# Patient Record
Sex: Female | Born: 2000 | Race: White | Hispanic: No | Marital: Single | Smoking: Never smoker
Health system: Southern US, Community
[De-identification: ages and names within clinical notes are randomized; demographics above are authoritative.]

## PROBLEM LIST (undated history)

## (undated) DIAGNOSIS — J302 Other seasonal allergic rhinitis: Secondary | ICD-10-CM

## (undated) DIAGNOSIS — Q185 Microstomia: Secondary | ICD-10-CM

## (undated) DIAGNOSIS — S82899A Other fracture of unspecified lower leg, initial encounter for closed fracture: Secondary | ICD-10-CM

---
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
## 2019-02-25 DIAGNOSIS — H5213 Myopia, bilateral: Secondary | ICD-10-CM | POA: Diagnosis not present

## 2019-03-03 DIAGNOSIS — Z23 Encounter for immunization: Secondary | ICD-10-CM | POA: Diagnosis not present

## 2019-03-19 DIAGNOSIS — H5213 Myopia, bilateral: Secondary | ICD-10-CM | POA: Diagnosis not present

## 2019-03-27 ENCOUNTER — Ambulatory Visit: Payer: Medicaid Other | Attending: Internal Medicine

## 2019-03-27 DIAGNOSIS — Z20828 Contact with and (suspected) exposure to other viral communicable diseases: Secondary | ICD-10-CM | POA: Diagnosis not present

## 2019-03-27 DIAGNOSIS — Z20822 Contact with and (suspected) exposure to covid-19: Secondary | ICD-10-CM

## 2019-03-28 LAB — NOVEL CORONAVIRUS, NAA: SARS-CoV-2, NAA: NOT DETECTED

## 2019-04-07 IMAGING — DX DG ANKLE COMPLETE 3+V*R*
3 series · 3 of 3 positions shown · non-contrast
Comparison: None.

CLINICAL DATA: Injury

EXAM:
RIGHT ANKLE - COMPLETE 3+ VIEW

[ankle ap]
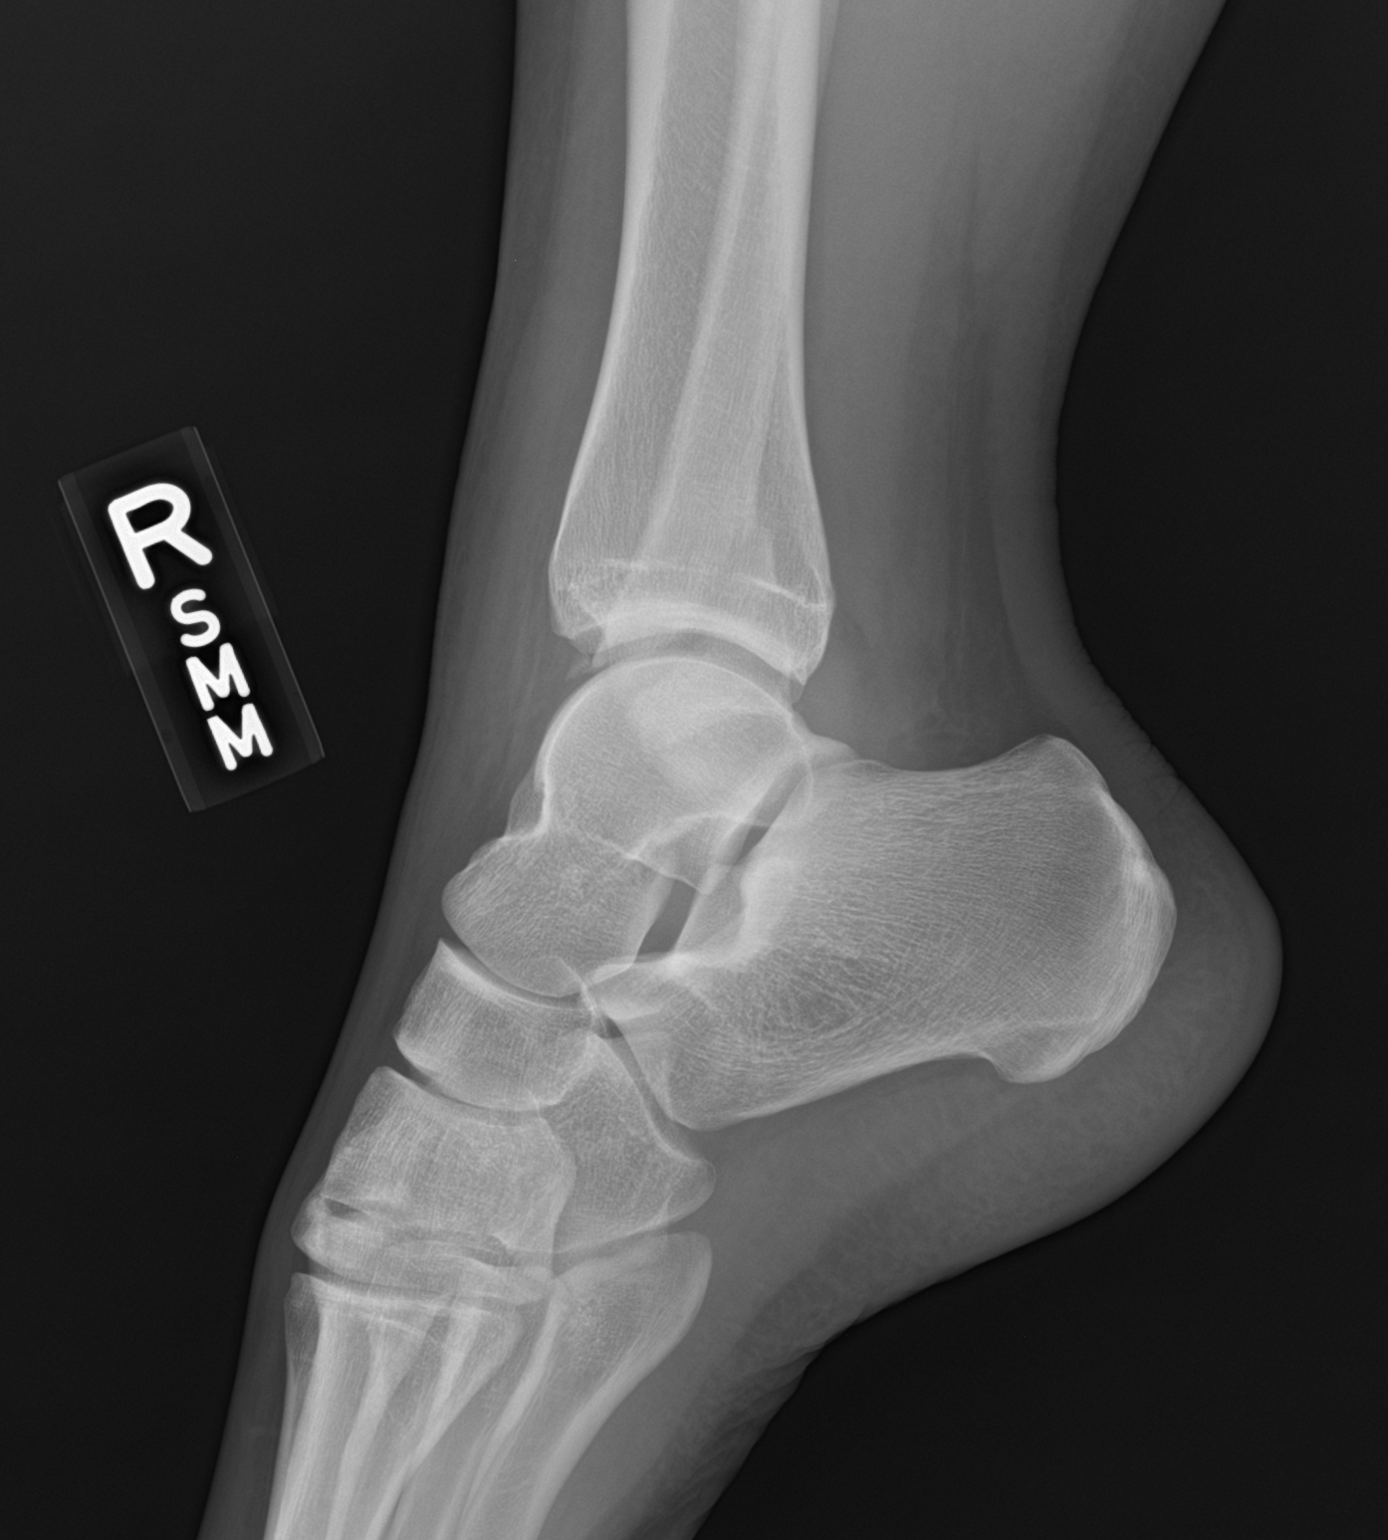

[ankle obl]
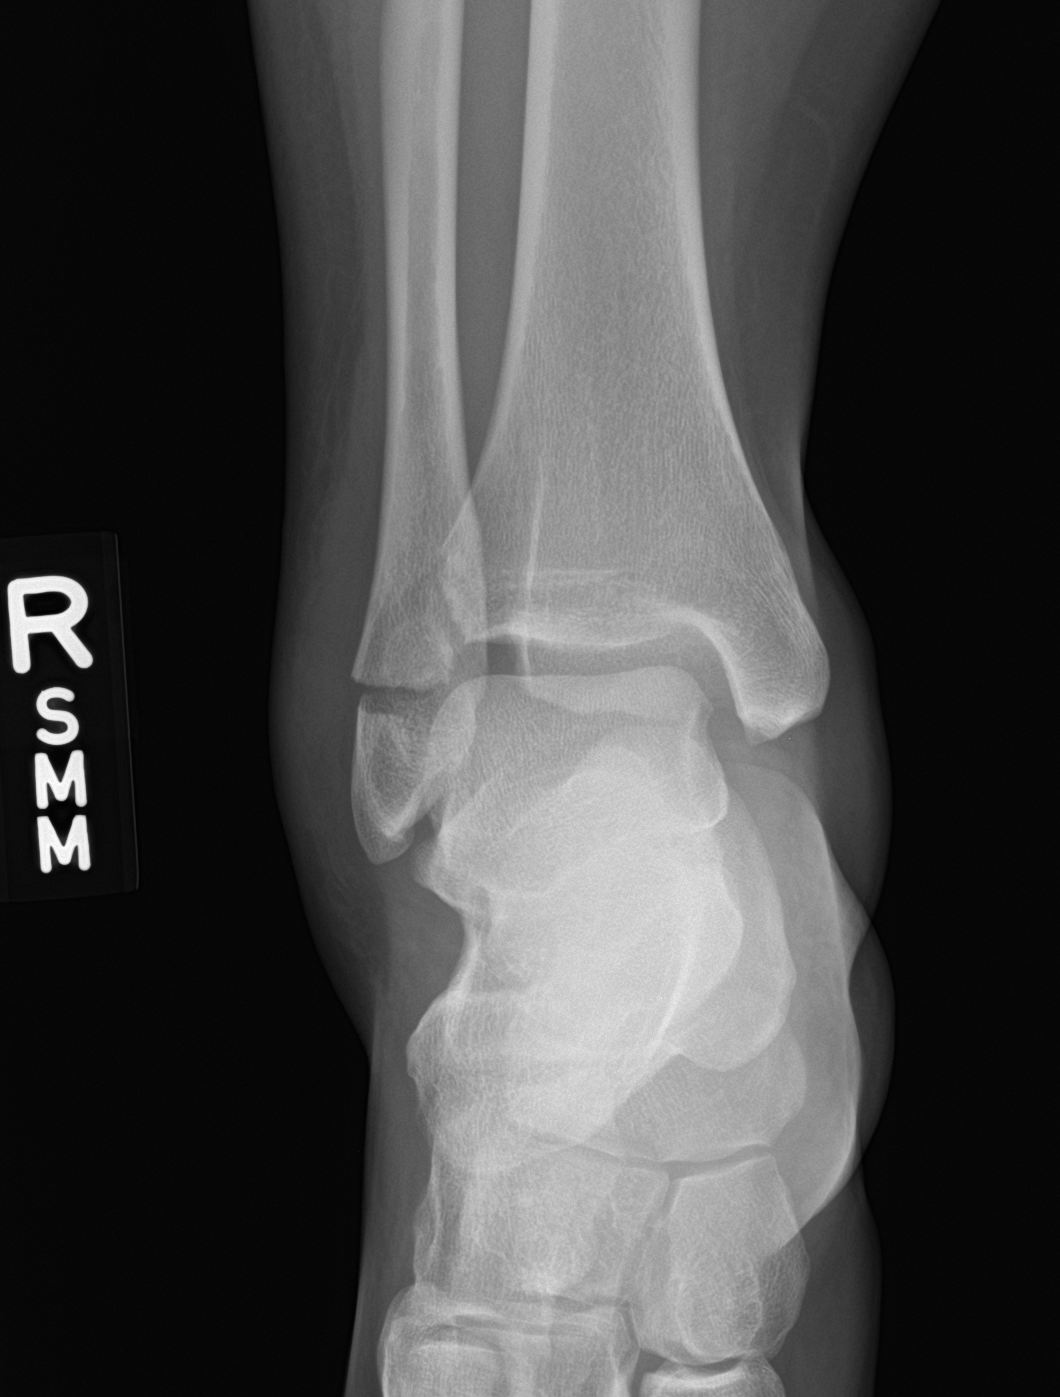

[ankle lat]
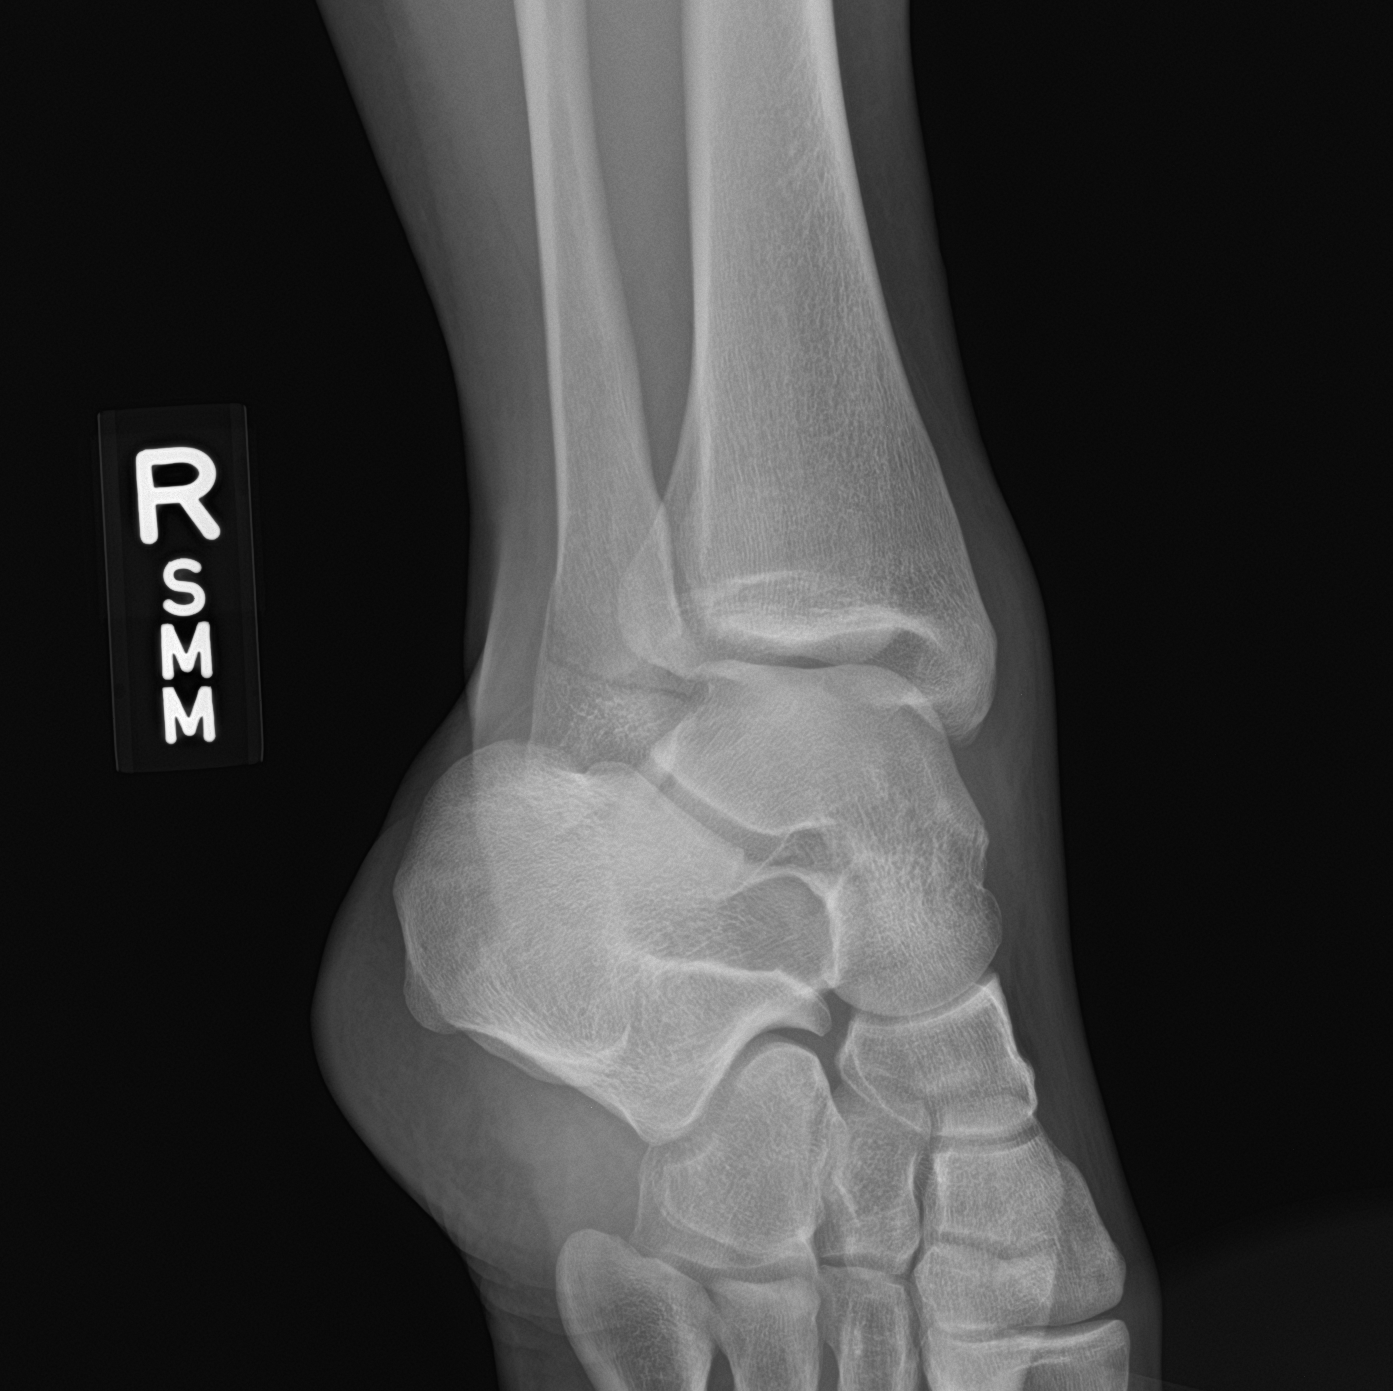

[3 of 3 positions shown; findings below may reference images not displayed]

FINDINGS: There is a transverse and displaced fracture in the distal fibula
through the lateral malleolus which extends into the ankle joint.
There is 5 mm of distraction at the fracture site. There is
associated soft tissue swelling. There is a tiny bony fragment
anterior to the tibial plafond the the lateral the ankle mortise is
not clearly visualized on the oblique view.
IMPRESSION: Displaced lateral malleolus fracture as described

Tiny bone fragment anterior to the tibial plafond on the lateral
view. The exact location is not clearly visualized. It may represent
a small fragment from the distal fibula.

## 2019-04-28 ENCOUNTER — Ambulatory Visit: Payer: Medicaid Other | Attending: Internal Medicine

## 2019-04-28 DIAGNOSIS — Z20822 Contact with and (suspected) exposure to covid-19: Secondary | ICD-10-CM | POA: Diagnosis not present

## 2019-04-29 LAB — NOVEL CORONAVIRUS, NAA: SARS-CoV-2, NAA: NOT DETECTED

## 2019-09-01 ENCOUNTER — Ambulatory Visit (INDEPENDENT_AMBULATORY_CARE_PROVIDER_SITE_OTHER): Payer: Medicaid Other | Admitting: Pediatrics

## 2019-09-01 ENCOUNTER — Encounter: Payer: Self-pay | Admitting: Pediatrics

## 2019-09-01 ENCOUNTER — Other Ambulatory Visit (HOSPITAL_COMMUNITY)
Admission: RE | Admit: 2019-09-01 | Discharge: 2019-09-01 | Disposition: A | Discharge status: Home / Self Care | Payer: Medicaid Other | Source: Ambulatory Visit | Attending: Pediatrics | Admitting: Pediatrics

## 2019-09-01 ENCOUNTER — Other Ambulatory Visit: Payer: Self-pay

## 2019-09-01 VITALS — BP 120/64 | HR 85 | Temp 99.4°F | Ht 66.0 in | Wt 178.4 lb

## 2019-09-01 DIAGNOSIS — E663 Overweight: Secondary | ICD-10-CM | POA: Diagnosis not present

## 2019-09-01 DIAGNOSIS — Z113 Encounter for screening for infections with a predominantly sexual mode of transmission: Secondary | ICD-10-CM

## 2019-09-01 DIAGNOSIS — Z3009 Encounter for other general counseling and advice on contraception: Secondary | ICD-10-CM

## 2019-09-01 DIAGNOSIS — Z68.41 Body mass index (BMI) pediatric, 85th percentile to less than 95th percentile for age: Secondary | ICD-10-CM | POA: Diagnosis not present

## 2019-09-01 DIAGNOSIS — Z3202 Encounter for pregnancy test, result negative: Secondary | ICD-10-CM | POA: Diagnosis not present

## 2019-09-01 DIAGNOSIS — Z23 Encounter for immunization: Secondary | ICD-10-CM | POA: Diagnosis not present

## 2019-09-01 DIAGNOSIS — Z6828 Body mass index (BMI) 28.0-28.9, adult: Secondary | ICD-10-CM

## 2019-09-01 DIAGNOSIS — J029 Acute pharyngitis, unspecified: Secondary | ICD-10-CM

## 2019-09-01 DIAGNOSIS — Z0001 Encounter for general adult medical examination with abnormal findings: Secondary | ICD-10-CM | POA: Diagnosis not present

## 2019-09-01 LAB — POCT URINE PREGNANCY: Preg Test, Ur: NEGATIVE

## 2019-09-01 LAB — POCT RAPID STREP A (OFFICE): Rapid Strep A Screen: NEGATIVE

## 2019-09-01 LAB — POCT RAPID HIV: Rapid HIV, POC: NEGATIVE

## 2019-09-01 MED ORDER — AMOXICILLIN 500 MG PO CAPS: 500.0000 mg | ORAL_CAPSULE | Freq: Two times a day (BID) | ORAL | 0 refills | Status: AC

## 2019-09-01 MED ORDER — NORETHINDRONE ACET-ETHINYL EST 1.5-30 MG-MCG PO TABS: 1.0000 | ORAL_TABLET | Freq: Every day | ORAL | 11 refills | Status: AC

## 2019-09-01 NOTE — Patient Instructions (Signed)
Adult Primary Care Clinics Name Criteria Services   [facility]  Address: [address] [place], [place] [address]  Phone: [phone] Hours: Monday - Friday 9 AM -6 PM  Types of insurance accepted:  Commercial insurance [facility] (orange card) Medicaid Medicare Uninsured  Language services:  Video and phone interpreters available   Ages 18 and older    Adult primary care Onsite pharmacy Integrated behavioral health Financial assistance counseling Walk-in hours for established patients  Financial assistance counseling hours: Tuesdays 2:00PM - 5:00PM  Thursday 8:30AM - 4:30PM  Space is limited, 10 on Tuesday and 20 on Thursday. It's on first come first serve basis  Name Criteria Services   [facility]  Address: [address] [place], [place] [address]  Phone: [phone]  Hours: Monday - Friday 8:30 AM - 5 PM  Types of insurance accepted:  Commercial insurance Medicaid Medicare Uninsured  Language services:  Video and phone interpreters available   All ages - newborn to adult   Primary care for all ages (children and adults) Integrated behavioral health Nutritionist Financial assistance counseling   Name Criteria Services   [facility]  Located on the ground floor of [facility]  Address: [address]  [place],  [place]  [address]  Phone: [phone]  Hours: Monday - Friday 8:15 AM - 5 PM  Types of insurance accepted:  Commercial insurance Medicaid Medicare Uninsured  Language services:  Video and phone interpreters available   Ages 18 and older   Adult primary care Nutritionist Certified Diabetes Educator  Integrated behavioral health Financial assistance counseling   Name Criteria Services    [facility]  Address: [address] [place], [place] [address]  Phone:  [phone]  Hours: Monday - Friday 8:30 AM - 5 PM    Types of insurance accepted:  Commercial insurance Medicaid Medicare Uninsured  Language services:  Video and phone interpreters available   All ages - newborn to adult   Primary care for all ages (children and adults) Integrated behavioral health Financial assistance counseling    
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
## 2019-09-02 LAB — URINE CYTOLOGY ANCILLARY ONLY
Chlamydia: NEGATIVE
Comment: NEGATIVE
Comment: NORMAL
Neisseria Gonorrhea: NEGATIVE

## 2019-09-03 LAB — CULTURE, GROUP A STREP
MICRO NUMBER:: 10517015
SPECIMEN QUALITY:: ADEQUATE

## 2019-09-03 NOTE — Progress Notes (Signed)
Patient called and left message in the nurse line stating that she missed a call from [place], she confirmed that she saw the lab results and Dr. Lona Kettle message via MyChart.

## 2019-09-04 ENCOUNTER — Telehealth: Payer: Self-pay

## 2019-09-04 ENCOUNTER — Telehealth: Payer: Self-pay | Admitting: Pediatrics

## 2019-09-04 MED ORDER — CLINDAMYCIN HCL 300 MG PO CAPS
300.0000 mg | ORAL_CAPSULE | Freq: Three times a day (TID) | ORAL | 0 refills | Status: AC
Start: 2019-09-04 — End: 2019-09-14

## 2019-09-04 NOTE — Telephone Encounter (Signed)
Patient notified

## 2019-09-04 NOTE — Telephone Encounter (Signed)
The patient states that she was prescribed Amoxicillin 500mg  for an throat infection. Dose is for five days and she's been taking it for three and is not getting better. She is requesting a higher dosage or another antibiotic as she will be leaving the state in two days.

## 2019-09-04 NOTE — Telephone Encounter (Signed)
Please complete the Amoxicillin as prescribed  I am going to ADD Clindamycin 300 mg three times a day for 10 days.   She may have some diarrhea with the additional medicine.   She may needs to seek medical care if she is still not improving in 5-7 days.   Usually mouth infections will get better on Amoxicillin or Clindamycin if the are bacterial.

## 2019-09-04 NOTE — Telephone Encounter (Signed)
Patient would like a call from Dr Kathlene November to discuss a concern with the medication that was prescribed. Number on file is correct.

## 2019-09-05 ENCOUNTER — Ambulatory Visit (HOSPITAL_COMMUNITY)
Admission: EM | Admit: 2019-09-05 | Discharge: 2019-09-05 | Disposition: A | Discharge status: Home / Self Care | Payer: Medicaid Other | Attending: Internal Medicine | Admitting: Internal Medicine

## 2019-09-05 ENCOUNTER — Other Ambulatory Visit: Payer: Self-pay

## 2019-09-05 ENCOUNTER — Encounter (HOSPITAL_COMMUNITY): Payer: Self-pay | Admitting: Emergency Medicine

## 2019-09-05 DIAGNOSIS — J029 Acute pharyngitis, unspecified: Secondary | ICD-10-CM | POA: Diagnosis not present

## 2019-09-05 LAB — CBC WITH DIFFERENTIAL/PLATELET
Abs Immature Granulocytes: 0.01 10*3/uL (ref 0.00–0.07)
Basophils Absolute: 0.1 10*3/uL (ref 0.0–0.1)
Basophils Relative: 1 %
Eosinophils Absolute: 0.1 10*3/uL (ref 0.0–0.5)
Eosinophils Relative: 1 %
HCT: 38.5 % (ref 36.0–46.0)
Hemoglobin: 12.5 g/dL (ref 12.0–15.0)
Immature Granulocytes: 0 %
Lymphocytes Relative: 57 %
Lymphs Abs: 4.1 10*3/uL — ABNORMAL HIGH (ref 0.7–4.0)
MCH: 28.4 pg (ref 26.0–34.0)
MCHC: 32.5 g/dL (ref 30.0–36.0)
MCV: 87.5 fL (ref 80.0–100.0)
Monocytes Absolute: 0.4 10*3/uL (ref 0.1–1.0)
Monocytes Relative: 6 %
Neutro Abs: 2.5 10*3/uL (ref 1.7–7.7)
Neutrophils Relative %: 35 %
Platelets: 234 10*3/uL (ref 150–400)
RBC: 4.4 MIL/uL (ref 3.87–5.11)
RDW: 12.8 % (ref 11.5–15.5)
WBC: 7.1 10*3/uL (ref 4.0–10.5)
nRBC: 0 % (ref 0.0–0.2)

## 2019-09-05 LAB — COMPREHENSIVE METABOLIC PANEL
ALT: 98 U/L — ABNORMAL HIGH (ref 0–44)
AST: 61 U/L — ABNORMAL HIGH (ref 15–41)
Albumin: 3.9 g/dL (ref 3.5–5.0)
Alkaline Phosphatase: 67 U/L (ref 38–126)
Anion gap: 10 (ref 5–15)
BUN: <5 mg/dL — ABNORMAL LOW (ref 6–20)
CO2: 26 mmol/L (ref 22–32)
Calcium: 9.3 mg/dL (ref 8.9–10.3)
Chloride: 104 mmol/L (ref 98–111)
Creatinine, Ser: 0.69 mg/dL (ref 0.44–1.00)
GFR calc Af Amer: >60 mL/min (ref >60)
GFR calc non Af Amer: >60 mL/min (ref >60)
Glucose, Bld: 109 mg/dL — ABNORMAL HIGH (ref 70–99)
Potassium: 4.3 mmol/L (ref 3.5–5.1)
Sodium: 140 mmol/L (ref 135–145)
Total Bilirubin: 0.6 mg/dL (ref 0.3–1.2)
Total Protein: 6.6 g/dL (ref 6.5–8.1)

## 2019-09-05 LAB — POCT RAPID STREP A: Streptococcus, Group A Screen (Direct): NEGATIVE

## 2019-09-05 LAB — POCT INFECTIOUS MONO SCREEN: Mono Screen: NEGATIVE

## 2019-09-05 NOTE — ED Provider Notes (Signed)
[facility]    CSN: [id number] Arrival date & time: 09/05/19  1122      History   Chief Complaint Chief Complaint  Patient presents with  . Sore Throat    HPI Allison Weaver is a 19 y.o. female comes to urgent care with complaints of persistent sore throat of several days duration.  Patient is fully vaccinated against COVID-19 virus.  She was seen several days ago and prescribed amoxicillin and subsequently clindamycin for sore throat with the presumption of strep throat or peritonsillar cellulitis.  Patient says that the sore throat has been worsening and she has not noted any improvement.  No fever or chills.  Covid test was negative.  She comes in for further evaluation.  Patient denies any fever or chills.  She has difficulty swallowing.  No nausea or vomiting.  No diarrhea.  No night sweats.  No abdominal pain.   HPI  Past Medical History:  Diagnosis Date  . Abnormally small mouth   . Ankle fracture 08/16/2016   right  . Seasonal allergies     Patient Active Problem List   Diagnosis Date Noted  . Behind on immunizations 11/18/2014    Past Surgical History:  Procedure Laterality Date  . ORIF ANKLE FRACTURE Right 08/21/2016   Procedure: OPEN REDUCTION INTERNAL FIXATION (ORIF) ANKLE FRACTURE;  Surgeon: Sheral Apley, MD;  Location: [facility];  Service: Orthopedics;  Laterality: Right;    OB History   No obstetric history on file.      Home Medications    Prior to Admission medications   Medication Sig Start Date End Date Taking? Authorizing Provider  amoxicillin (AMOXIL) 500 MG capsule Take 1 capsule (500 mg total) by mouth 2 (two) times daily for 5 days. 09/01/19 09/06/19  Theadore Nan, MD  clindamycin (CLEOCIN) 300 MG capsule Take 1 capsule (300 mg total) by mouth 3 (three) times daily for 10 days. 09/04/19 09/14/19  Theadore Nan, MD  Norethindrone Acetate-Ethinyl Estradiol (LOESTRIN) 1.5-30 MG-MCG tablet Take 1 tablet by mouth  daily. 09/01/19   Theadore Nan, MD    Family History Family History  Problem Relation Age of Onset  . Hypertension Maternal Grandmother   . Heart disease Paternal Grandfather        MI  . Hypertension Paternal Grandfather     Social History Social History   Tobacco Use  . Smoking status: Never Smoker  . Smokeless tobacco: Never Used  Substance Use Topics  . Alcohol use: No    Alcohol/week: 0.0 standard drinks  . Drug use: No     Allergies   Patient has no known allergies.   Review of Systems Review of Systems  Constitutional: Positive for activity change, fatigue and fever. Negative for chills.  HENT: Positive for sore throat. Negative for congestion.   Respiratory: Negative.   Gastrointestinal: Negative for abdominal pain, nausea and vomiting.  Musculoskeletal: Negative.   Neurological: Negative.   Psychiatric/Behavioral: Negative.      Physical Exam Triage Vital Signs ED Triage Vitals [09/05/19 1156]  Enc Vitals Group     BP 140/85     Pulse Rate (!) 101     Resp 18     Temp 99.6 F (37.6 C)     Temp Source Oral     SpO2 96 %     Weight      Height      Head Circumference      Peak Flow  Pain Score 6     Pain Loc      Pain Edu?      Excl. in [place]?    No data found.  Updated Vital Signs BP 140/85 (BP Location: Right Arm)   Pulse (!) 101   Temp 99.6 F (37.6 C) (Oral)   Resp 18   SpO2 96%   Visual Acuity Right Eye Distance:   Left Eye Distance:   Bilateral Distance:    Right Eye Near:   Left Eye Near:    Bilateral Near:     Physical Exam Vitals and nursing note reviewed.  Constitutional:      General: She is in acute distress.     Appearance: She is not ill-appearing.  HENT:     Right Ear: Tympanic membrane normal.     Left Ear: Tympanic membrane normal.     Nose: No congestion or rhinorrhea.     Mouth/Throat:     Mouth: Mucous membranes are moist. No oral lesions.     Pharynx: No posterior oropharyngeal erythema.      Tonsils: Tonsillar exudate present. No tonsillar abscesses. 1+ on the right. 1+ on the left.  Cardiovascular:     Rate and Rhythm: Normal rate and regular rhythm.  Abdominal:     General: Bowel sounds are normal.     Palpations: Abdomen is soft. There is no mass.     Tenderness: There is no abdominal tenderness. There is no guarding.  Musculoskeletal:     Cervical back: Normal range of motion and neck supple.  Lymphadenopathy:     Cervical: Cervical adenopathy present.  Neurological:     Mental Status: She is alert.      UC Treatments / Results  Labs (all labs ordered are listed, but only abnormal results are displayed) Labs Reviewed  CBC WITH DIFFERENTIAL/PLATELET - Abnormal; Notable for the following components:      Result Value   Lymphs Abs 4.1 (*)    All other components within normal limits  COMPREHENSIVE METABOLIC PANEL - Abnormal; Notable for the following components:   Glucose, Bld 109 (*)    BUN <5 (*)    AST 61 (*)    ALT 98 (*)    All other components within normal limits  CULTURE, GROUP A STREP [facility])  POCT RAPID STREP A  POCT INFECTIOUS MONO SCREEN  POCT INFECTIOUS MONO SCREEN    EKG   Radiology No results found.  Procedures Procedures (including critical care time)  Medications Ordered in UC Medications - No data to display  Initial Impression / Assessment and Plan / UC Course  I have reviewed the triage vital signs and the nursing notes.  Pertinent labs & imaging results that were available during my care of the patient were reviewed by me and considered in my medical decision making (see chart for details).     1.  Viral pharyngitis-suspected infectious mononucleosis: Monospot test is negative CBC, CMP Warm salt water gargle Discontinue clindamycin Completed course of amoxicillin Return precautions given Cepacol lozenges as needed If patient symptoms worsens she is advised to go to the emergency department to be reevaluated. Final  Clinical Impressions(s) / UC Diagnoses   Final diagnoses:  Viral pharyngitis     Discharge Instructions     Continue warm salt water gargle Tylenol/Motrin for throat pain Over-the-counter Cepacol lozenges as needed Complete course of amoxicillin.   ED Prescriptions    None     PDMP not reviewed this encounter.  Merrilee Jansky, MD 09/05/19 [phone]

## 2019-09-05 NOTE — Discharge Instructions (Signed)
Continue warm salt water gargle Tylenol/Motrin for throat pain Over-the-counter Cepacol lozenges as needed Complete course of amoxicillin.

## 2019-09-05 NOTE — ED Triage Notes (Signed)
Pt here for sore throat and is currently taking amoxicillin and clindamycin without any improvement; pt sts her parents are concerned for mono

## 2019-09-07 ENCOUNTER — Ambulatory Visit (HOSPITAL_COMMUNITY)
Admission: EM | Admit: 2019-09-07 | Discharge: 2019-09-07 | Disposition: A | Discharge status: Home / Self Care | Payer: Medicaid Other | Attending: Family Medicine | Admitting: Family Medicine

## 2019-09-07 ENCOUNTER — Other Ambulatory Visit: Payer: Self-pay

## 2019-09-07 ENCOUNTER — Encounter (HOSPITAL_COMMUNITY): Payer: Self-pay

## 2019-09-07 DIAGNOSIS — J358 Other chronic diseases of tonsils and adenoids: Secondary | ICD-10-CM | POA: Diagnosis not present

## 2019-09-07 DIAGNOSIS — Z793 Long term (current) use of hormonal contraceptives: Secondary | ICD-10-CM | POA: Insufficient documentation

## 2019-09-07 DIAGNOSIS — Z20822 Contact with and (suspected) exposure to covid-19: Secondary | ICD-10-CM | POA: Insufficient documentation

## 2019-09-07 DIAGNOSIS — R590 Localized enlarged lymph nodes: Secondary | ICD-10-CM | POA: Insufficient documentation

## 2019-09-07 DIAGNOSIS — R070 Pain in throat: Secondary | ICD-10-CM

## 2019-09-07 LAB — CULTURE, GROUP A STREP (THRC)

## 2019-09-07 MED ORDER — PREDNISONE 20 MG PO TABS: ORAL_TABLET | ORAL | 0 refills | Status: AC

## 2019-09-07 NOTE — ED Triage Notes (Signed)
Pt c/o swollen throat glands, throat is white per pt, runny nose, and neck painx1 wk. Pt c/o dysphagia.

## 2019-09-07 NOTE — Discharge Instructions (Signed)
Hold off on using Tylenol for now. Start prednisone course to help and follow up with [facility]NT tomorrow.

## 2019-09-07 NOTE — ED Provider Notes (Signed)
[facility]   MRN: [id number] DOB: November 14, 2000  Subjective:   Allison Weaver is a 19 y.o. female presenting for 8-day history of persistent malaise including throat pain, difficulty swallowing, lymph node pain and swelling now having nasal congestion, neck pain. Patient initially saw her PCP, had negative rapid strep, throat culture and HIV testing. She underwent course of amoxicillin which she finished. When she did not improve, she was prescribed clindamycin. She took four doses before coming to our clinic. She again had negative strep testing, additional testing showed negative Monospot, elevated liver enzymes, borderline elevation in lymphocytes. She started using Tylenol since her last visit.  No current facility-administered medications for this encounter.  Current Outpatient Medications:  .  clindamycin (CLEOCIN) 300 MG capsule, Take 1 capsule (300 mg total) by mouth 3 (three) times daily for 10 days., Disp: 30 capsule, Rfl: 0 .  Norethindrone Acetate-Ethinyl Estradiol (LOESTRIN) 1.5-30 MG-MCG tablet, Take 1 tablet by mouth daily., Disp: 1 Package, Rfl: 11   No Known Allergies  Past Medical History:  Diagnosis Date  . Abnormally small mouth   . Ankle fracture 08/16/2016   right  . Seasonal allergies      Past Surgical History:  Procedure Laterality Date  . ORIF ANKLE FRACTURE Right 08/21/2016   Procedure: OPEN REDUCTION INTERNAL FIXATION (ORIF) ANKLE FRACTURE;  Surgeon: Sheral Apley, MD;  Location: [facility];  Service: Orthopedics;  Laterality: Right;    Family History  Problem Relation Age of Onset  . Hypertension Maternal Grandmother   . Heart disease Paternal Grandfather        MI  . Hypertension Paternal Grandfather     Social History   Tobacco Use  . Smoking status: Never Smoker  . Smokeless tobacco: Never Used  Substance Use Topics  . Alcohol use: No    Alcohol/week: 0.0 standard drinks  . Drug use: No    Review of Systems    Constitutional: Positive for malaise/fatigue. Negative for fever.  HENT: Positive for congestion and sore throat. Negative for ear pain and sinus pain.   Eyes: Negative for discharge and redness.  Respiratory: Negative for cough, hemoptysis, shortness of breath and wheezing.   Cardiovascular: Negative for chest pain.  Gastrointestinal: Negative for abdominal pain, diarrhea, nausea and vomiting.  Genitourinary: Negative for dysuria, flank pain and hematuria.  Musculoskeletal: Positive for neck pain. Negative for myalgias.  Skin: Negative for rash.  Neurological: Negative for dizziness, weakness and headaches.  Psychiatric/Behavioral: Negative for depression and substance abuse.     Objective:   Vitals: BP 114/73   Pulse 99   Temp 99.5 F (37.5 C) (Oral)   Resp 18   Ht 5\' 6"  (1.676 m)   Wt 180 lb (81.6 kg)   SpO2 99%   BMI 29.05 kg/m   Physical Exam Constitutional:      General: She is not in acute distress.    Appearance: Normal appearance. She is well-developed and normal weight. She is not ill-appearing, toxic-appearing or diaphoretic.  HENT:     Head: Normocephalic and atraumatic.     Right Ear: Tympanic membrane and external ear normal.     Left Ear: Tympanic membrane and external ear normal.     Nose: Congestion present.     Mouth/Throat:     Mouth: Mucous membranes are moist.     Pharynx: Oropharyngeal exudate and posterior oropharyngeal erythema present.     Tonsils: 1+ on the right. 1+ on the left.  Eyes:  General: No scleral icterus.       Right eye: No discharge.        Left eye: No discharge.     Extraocular Movements: Extraocular movements intact.     Conjunctiva/sclera: Conjunctivae normal.     Pupils: Pupils are equal, round, and reactive to light.  Cardiovascular:     Rate and Rhythm: Normal rate and regular rhythm.     Pulses: Normal pulses.     Heart sounds: Normal heart sounds. No murmur. No friction rub. No gallop.   Pulmonary:     Effort:  Pulmonary effort is normal. No respiratory distress.     Breath sounds: Normal breath sounds. No stridor. No wheezing, rhonchi or rales.  Abdominal:     General: Bowel sounds are normal. There is no distension.     Palpations: Abdomen is soft. There is no mass.     Tenderness: There is no abdominal tenderness. There is no right CVA tenderness, left CVA tenderness, guarding or rebound.     Comments: No hepatosplenomegaly appreciated on exam.  Musculoskeletal:     Cervical back: Tenderness present. No rigidity.  Lymphadenopathy:     Cervical: Cervical adenopathy present.  Skin:    General: Skin is warm and dry.     Coloration: Skin is not pale.     Findings: No rash.  Neurological:     General: No focal deficit present.     Mental Status: She is alert and oriented to person, place, and time.     Comments: Negative Kernig and Brudzinski.  Psychiatric:        Mood and Affect: Mood normal.        Behavior: Behavior normal.        Thought Content: Thought content normal.        Judgment: Judgment normal.     Results for orders placed or performed during the hospital encounter of 09/05/19 (from the past 72 hour(s))  POCT rapid strep A [facility])     Status: None   Collection Time: 09/05/19 12:24 PM  Result Value Ref Range   Streptococcus, Group A Screen (Direct) NEGATIVE NEGATIVE  CBC with Differential     Status: Abnormal   Collection Time: 09/05/19 12:35 PM  Result Value Ref Range   WBC 7.1 4.0 - 10.5 K/uL   RBC 4.40 3.87 - 5.11 MIL/uL   Hemoglobin 12.5 12.0 - 15.0 g/dL   HCT 92.4 26.8 - 34.1 %   MCV 87.5 80.0 - 100.0 fL   MCH 28.4 26.0 - 34.0 pg   MCHC 32.5 30.0 - 36.0 g/dL   RDW 96.2 22.9 - 79.8 %   Platelets 234 150 - 400 K/uL   nRBC 0.0 0.0 - 0.2 %   Neutrophils Relative % 35 %   Neutro Abs 2.5 1.7 - 7.7 K/uL   Lymphocytes Relative 57 %   Lymphs Abs 4.1 (H) 0.7 - 4.0 K/uL   Monocytes Relative 6 %   Monocytes Absolute 0.4 0.1 - 1.0 K/uL   Eosinophils Relative 1  %   Eosinophils Absolute 0.1 0.0 - 0.5 K/uL   Basophils Relative 1 %   Basophils Absolute 0.1 0.0 - 0.1 K/uL   WBC Morphology MORPHOLOGY UNREMARKABLE    Immature Granulocytes 0 %   Abs Immature Granulocytes 0.01 0.00 - 0.07 K/uL    Comment: Performed at [facility], [address] [place], [place] [address]  Comprehensive metabolic panel     Status: Abnormal  Collection Time: 09/05/19 12:35 PM  Result Value Ref Range   Sodium 140 135 - 145 mmol/L   Potassium 4.3 3.5 - 5.1 mmol/L   Chloride 104 98 - 111 mmol/L   CO2 26 22 - 32 mmol/L   Glucose, Bld 109 (H) 70 - 99 mg/dL    Comment: Glucose reference range applies only to samples taken after fasting for at least 8 hours.   BUN <5 (L) 6 - 20 mg/dL   Creatinine, Ser 0.69 0.44 - 1.00 mg/dL   Calcium 9.3 8.9 - 10.3 mg/dL   Total Protein 6.6 6.5 - 8.1 g/dL   Albumin 3.9 3.5 - 5.0 g/dL   AST 61 (H) 15 - 41 U/L   ALT 98 (H) 0 - 44 U/L   Alkaline Phosphatase 67 38 - 126 U/L   Total Bilirubin 0.6 0.3 - 1.2 mg/dL   GFR calc non Af Amer >60 >60 mL/min   GFR calc Af Amer >60 >60 mL/min   Anion gap 10 5 - 15    Comment: Performed at [facility], [place] [address], [place], [place] [address]  Culture, group A strep (throat)     Status: None (Preliminary result)   Collection Time: 09/05/19 12:41 PM   Specimen: Throat  Result Value Ref Range   Specimen Description THROAT    Special Requests NONE    Culture      NO GROUP A STREP (S.PYOGENES) ISOLATED Performed at [facility], [place] [address] [place], [place] [address]    Report Status PENDING   Infectious mono screen, POC     Status: None   Collection Time: 09/05/19 12:59 PM  Result Value Ref Range   Mono Screen NEGATIVE NEGATIVE     Assessment and Plan :   PDMP not reviewed this encounter.  1. Cervical lymphadenopathy   2. Throat pain   3. Tonsillar exudate    Case discussed with Dr. Meda Coffee. Epstein-Barr virus blood panel pending. Will recommend patient use  short prednisone course. Follow-up with ENT ASAP. Hold off on using Tylenol. Given multiple negative rapid strep tests, throat cultures and normal white blood cell count will hold off on additional antibiotics. Follow-up with PCP otherwise. Counseled patient on potential for adverse effects with medications prescribed/recommended today, ER and return-to-clinic precautions discussed, patient verbalized understanding.      Jaynee Eagles, PA-C 09/07/19 1106

## 2019-09-08 DIAGNOSIS — B2700 Gammaherpesviral mononucleosis without complication: Secondary | ICD-10-CM | POA: Diagnosis not present

## 2019-09-08 DIAGNOSIS — B9789 Other viral agents as the cause of diseases classified elsewhere: Secondary | ICD-10-CM | POA: Diagnosis not present

## 2019-09-08 DIAGNOSIS — R590 Localized enlarged lymph nodes: Secondary | ICD-10-CM | POA: Diagnosis not present

## 2019-09-08 DIAGNOSIS — J038 Acute tonsillitis due to other specified organisms: Secondary | ICD-10-CM | POA: Diagnosis not present

## 2019-09-08 LAB — EPSTEIN-BARR VIRUS (EBV) ANTIBODY PROFILE
EBV NA IgG: <18 U/mL (ref 0.0–17.9)
EBV VCA IgG: 71 U/mL — ABNORMAL HIGH (ref 0.0–17.9)
EBV VCA IgM: >160 U/mL — ABNORMAL HIGH (ref 0.0–35.9)

## 2019-09-08 LAB — SARS CORONAVIRUS 2 (TAT 6-24 HRS): SARS Coronavirus 2: NEGATIVE

## 2020-02-29 ENCOUNTER — Telehealth: Payer: Self-pay

## 2020-02-29 NOTE — Telephone Encounter (Signed)
Transition Care Management Follow-up Telephone Call  Date of discharge and from where: 02/27/2020 [facility]  How have you been since you were released from the hospital? Feeling fine.   Any questions or concerns? No  Items Reviewed:  Did the pt receive and understand the discharge instructions provided? Yes   Medications obtained and verified? Yes   Other? No   Any new allergies since your discharge? No   Dietary orders reviewed? Yes  Do you have support at home? Patient in college in [employer] Care and Equipment/Supplies: Were home health services ordered? not applicable If so, what is the name of the agency? na  Has the agency set up a time to come to the patient's home? not applicable Were any new equipment or medical supplies ordered?  Yes: Crutches What is the name of the medical supply agency? na Were you able to get the supplies/equipment? yes Do you have any questions related to the use of the equipment or supplies? No  Functional Questionnaire: (I = Independent and D = Dependent) ADLs: I  Bathing/Dressing- I  Meal Prep- I  Eating- I  Maintaining continence- I  Transferring/Ambulation- I  Managing Meds- I  Follow up appointments reviewed:   PCP Hospital f/u appt confirmed? No    Specialist Hospital f/u appt confirmed? No    Are transportation arrangements needed? No   If their condition worsens, is the pt aware to call PCP or go to the Emergency Dept.? Yes  Was the patient provided with contact information for the PCP's office or ED? Yes  Was to pt encouraged to call back with questions or concerns? Yes

## 2020-02-29 NOTE — Telephone Encounter (Signed)
Transition Care Management Unsuccessful Follow-up Telephone Call  Date of discharge and from where: 02/27/2020 [facility]   Attempts:  1st Attempt  Reason for unsuccessful TCM follow-up call:  Left voice message

## 2020-04-21 DIAGNOSIS — Z1152 Encounter for screening for COVID-19: Secondary | ICD-10-CM | POA: Diagnosis not present

## 2020-06-11 ENCOUNTER — Ambulatory Visit (HOSPITAL_COMMUNITY)
Admission: EM | Admit: 2020-06-11 | Discharge: 2020-06-11 | Disposition: A | Discharge status: Home / Self Care | Payer: Medicaid Other | Attending: Family Medicine | Admitting: Family Medicine

## 2020-06-11 ENCOUNTER — Other Ambulatory Visit: Payer: Self-pay

## 2020-06-11 ENCOUNTER — Encounter (HOSPITAL_COMMUNITY): Payer: Self-pay

## 2020-06-11 ENCOUNTER — Ambulatory Visit (INDEPENDENT_AMBULATORY_CARE_PROVIDER_SITE_OTHER): Payer: Medicaid Other

## 2020-06-11 DIAGNOSIS — X58XXXA Exposure to other specified factors, initial encounter: Secondary | ICD-10-CM | POA: Diagnosis not present

## 2020-06-11 DIAGNOSIS — S99921A Unspecified injury of right foot, initial encounter: Secondary | ICD-10-CM

## 2020-06-11 MED ORDER — MELOXICAM 15 MG PO TABS
15.0000 mg | ORAL_TABLET | Freq: Every day | ORAL | 0 refills | Status: AC
Start: 2020-06-11 — End: ?

## 2020-06-11 NOTE — ED Provider Notes (Signed)
[facility]    CSN: [id number] Arrival date & time: 06/11/20  1042      History   Chief Complaint Chief Complaint  Patient presents with  . Foot Injury    HPI Allison Weaver is a 20 y.o. female.   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  She subsequently performed as usual however following the performance she did apply ice and elevated right foot and and experienced mild bruising and tenderness for period of 3 days that resolved.  Over the last several days the pain has returned and she is having pain with standing and with ambulation.  Pain is present at the distal portion of her great toe and second toe and is localized.  She is not experiencing any pain with movement of her toes.  Denies any prior right foot or right toe fractures.  She has had a fracture involving the right ankle which the ankle was not impacted by foot injury, Past Medical History:  Diagnosis Date  . Abnormally small mouth   . Ankle fracture 08/16/2016   right  . Seasonal allergies     Patient Active Problem List   Diagnosis Date Noted  . Behind on immunizations 11/18/2014    Past Surgical History:  Procedure Laterality Date  . ORIF ANKLE FRACTURE Right 08/21/2016   Procedure: OPEN REDUCTION INTERNAL FIXATION (ORIF) ANKLE FRACTURE;  Surgeon: Sheral Apley, MD;  [facility];  Service: Orthopedics;  Laterality: Right;    OB History   No obstetric history on file.      Home Medications    Prior to Admission medications   Medication Sig Start Date End Date Taking? Authorizing Provider  Norethindrone Acetate-Ethinyl Estradiol (LOESTRIN) 1.5-30 MG-MCG tablet Take 1 tablet by mouth daily. 09/01/19   Theadore Nan, MD  predniSONE (DELTASONE) 20 MG tablet Take 2 tablets daily with breakfast. 09/07/19   Wallis Bamberg, PA-C    Family History Family History  Problem Relation Age of  Onset  . Hypertension Maternal Grandmother   . Heart disease Paternal Grandfather        MI  . Hypertension Paternal Grandfather     Social History Social History   Tobacco Use  . Smoking status: Never Smoker  . Smokeless tobacco: Never Used  Vaping Use  . Vaping Use: Never used  Substance Use Topics  . Alcohol use: No    Alcohol/week: 0.0 standard drinks  . Drug use: No     Allergies   Patient has no known allergies.   Review of Systems Review of Systems Pertinent negatives listed in HPI   Physical Exam Triage Vital Signs ED Triage Vitals  Enc Vitals Group     BP 06/11/20 1142 115/61     Pulse Rate 06/11/20 1142 79     Resp 06/11/20 1142 18     Temp 06/11/20 1142 98.8 F (37.1 C)     Temp Source 06/11/20 1142 Oral     SpO2 06/11/20 1142 100 %     Weight --      Height --      Head Circumference --      Peak Flow --      Pain Score 06/11/20 1140 6     Pain Loc --      Pain Edu? --      Excl. in GC? --    No data found.  Updated Vital Signs BP 115/61 (BP Location: Right Arm)   Pulse 79   Temp 98.8 F (37.1 C) (Oral)   Resp 18   LMP 05/30/2020   SpO2 100%   Visual Acuity Right Eye Distance:   Left Eye Distance:   Bilateral Distance:    Right Eye Near:   Left Eye Near:    Bilateral Near:     Physical Exam General appearance: Alert, no acute distress, cooperative Head: Normocephalic, without obvious abnormality, atraumatic Respiratory: Respirations even and unlabored, normal respiratory rate Heart: rate and rhythm normal. No gallop or murmurs noted on exam  Abdomen: BS +, no distention, no rebound tenderness, or no mass Extremities: right foot without deformity or obvious injury Skin: Skin color, texture, turgor normal. No rashes seen  Psych: Appropriate mood and affect. Neurologic: GCS 15, normal coordination, normal gait   UC Treatments / Results  Labs (all labs ordered are listed, but only abnormal results are displayed) Labs  Reviewed - No data to display  EKG   Radiology No results found.  Procedures Procedures (including critical care time)  Medications Ordered in UC Medications - No data to display  Initial Impression / Assessment and Plan / UC Course  I have reviewed the triage vital signs and the nursing notes.  Pertinent labs & imaging results that were available during my care of the patient were reviewed by me and considered in my medical decision making (see chart for details).     Imaging of right foot negative for any acute fracture.  Patient likely sustained a contusion related to direct impact of a heavy object onto foot.  Recommend continue conservative treatment with Ace wrapping, warm foot soaks along with ice applications.  Prescribed meloxicam 15 mg once daily for anti-inflammatory action.  Advised to discontinue ibuprofen or naproxen while taking meloxicam.  Recommended given symptoms for 2 to 3 weeks to completely resolve if pain remains present recommend follow-up with podiatry.  Final Clinical Impressions(s) / UC Diagnoses   Final diagnoses:  Injury of right foot, initial encounter     Discharge Instructions     Imaging was negative for any acute fracture.  You likely sustained a contusion to your foot due to the impact of the metal object.  Recommend wearing compression socks or wrapping foot with ACE wrap.  For acute pain I recommend Meloxicam daily as needed, Soaking in warm water and icing as needed. If no relief, recommend follow-up with Podiatry.    ED Prescriptions    Medication Sig Dispense Auth. Provider   meloxicam (MOBIC) 15 MG tablet Take 1 tablet (15 mg total) by mouth daily. 30 tablet Bing Neighbors, FNP     PDMP not reviewed this encounter.   Bing Neighbors, FNP 06/11/20 1423

## 2020-06-11 NOTE — ED Triage Notes (Signed)
Pt presents with right foot injury after a heavy metal object dropped on it X 2 weeks ago.

## 2020-06-11 NOTE — Discharge Instructions (Signed)
Imaging was negative for any acute fracture.  You likely sustained a contusion to your foot due to the impact of the metal object.  Recommend wearing compression socks or wrapping foot with ACE wrap.  For acute pain I recommend Meloxicam daily as needed, Soaking in warm water and icing as needed. If no relief, recommend follow-up with Podiatry.
